# Patient Record
Sex: Male | Born: 1985 | Race: White | Hispanic: No | Marital: Single | Smoking: Current every day smoker
Health system: Southern US, Community
[De-identification: ages and names within clinical notes are randomized; demographics above are authoritative.]

## PROBLEM LIST (undated history)

## (undated) ENCOUNTER — Ambulatory Visit (HOSPITAL_COMMUNITY): Admission: EM | Payer: Self-pay | Source: Home / Self Care

---

## 2018-05-29 ENCOUNTER — Emergency Department: Payer: Self-pay

## 2018-05-29 ENCOUNTER — Emergency Department
Admission: EM | Admit: 2018-05-29 | Discharge: 2018-05-29 | Disposition: A | Discharge status: Home / Self Care | Payer: Self-pay | Attending: Emergency Medicine | Admitting: Emergency Medicine

## 2018-05-29 ENCOUNTER — Other Ambulatory Visit: Payer: Self-pay

## 2018-05-29 DIAGNOSIS — Z79899 Other long term (current) drug therapy: Secondary | ICD-10-CM | POA: Insufficient documentation

## 2018-05-29 DIAGNOSIS — R062 Wheezing: Secondary | ICD-10-CM | POA: Insufficient documentation

## 2018-05-29 DIAGNOSIS — J209 Acute bronchitis, unspecified: Secondary | ICD-10-CM | POA: Insufficient documentation

## 2018-05-29 DIAGNOSIS — F17210 Nicotine dependence, cigarettes, uncomplicated: Secondary | ICD-10-CM | POA: Insufficient documentation

## 2018-05-29 MED ORDER — PREDNISONE 10 MG (21) PO TBPK: ORAL_TABLET | ORAL | 0 refills | Status: AC

## 2018-05-29 MED ORDER — BENZONATATE 200 MG PO CAPS: 200.0000 mg | ORAL_CAPSULE | Freq: Three times a day (TID) | ORAL | 0 refills | Status: AC | PRN

## 2018-05-29 MED ORDER — ALBUTEROL SULFATE (2.5 MG/3ML) 0.083% IN NEBU
2.5000 mg | INHALATION_SOLUTION | Freq: Once | RESPIRATORY_TRACT | Status: AC
  Administered 2018-05-29: 2.5 mg via RESPIRATORY_TRACT
  Filled 2018-05-29: qty 3

## 2018-05-29 MED ORDER — ALBUTEROL SULFATE HFA 108 (90 BASE) MCG/ACT IN AERS: 2.0000 | INHALATION_SPRAY | Freq: Four times a day (QID) | RESPIRATORY_TRACT | 2 refills | Status: AC | PRN

## 2018-05-29 MED ORDER — IPRATROPIUM-ALBUTEROL 0.5-2.5 (3) MG/3ML IN SOLN
3.0000 mL | Freq: Once | RESPIRATORY_TRACT | Status: AC
  Administered 2018-05-29: 3 mL via RESPIRATORY_TRACT
  Filled 2018-05-29: qty 3

## 2018-05-29 MED ORDER — ALBUTEROL SULFATE (2.5 MG/3ML) 0.083% IN NEBU
INHALATION_SOLUTION | RESPIRATORY_TRACT | Status: AC
  Filled 2018-05-29: qty 3

## 2018-05-29 MED ORDER — AZITHROMYCIN 250 MG PO TABS: ORAL_TABLET | ORAL | 0 refills | Status: AC

## 2018-05-29 NOTE — ED Triage Notes (Signed)
Cough cold sx x 1 week.

## 2018-05-29 NOTE — Discharge Instructions (Addendum)
Follow-up with your regular doctor the acute care if not better in 3 to 5 days.  Take the medications as prescribed.  If you are worsening please return the emergency department.

## 2018-05-29 NOTE — ED Triage Notes (Signed)
Pt c/o cough, congestion, URI sx, body aches and fever X 2 weeks. Pt alert and oriented X4, active, cooperative, pt in NAD. RR even and unlabored, color WNL.

## 2018-05-29 NOTE — ED Provider Notes (Signed)
[facility] Emergency Department Provider Note  ____________________________________________   First MD Initiated Contact with Patient 05/29/18 1206     (approximate)  I have reviewed the triage vital signs and the nursing notes.   HISTORY  Chief Complaint Cough; Generalized Body Aches; and Fever    HPI Shawn Roach is a 32 y.o. male presents emergency department complaining of cough and congestion with yellow to green mucus.  States [place] in [place] change in [place] is affected him.  He states he is starting to have some difficulty breathing due to wheezing.  He states he started having fever and body aches about 2 days ago.  Cough symptoms for 14 days.  Denies  chills, chest pain or shortness of breath.   History reviewed. No pertinent past medical history.  There are no active problems to display for this patient.   History reviewed. No pertinent surgical history.  Prior to Admission medications   Medication Sig Start Date End Date Taking? Authorizing Provider  albuterol (PROVENTIL HFA;VENTOLIN HFA) 108 (90 Base) MCG/ACT inhaler Inhale 2 puffs into the lungs every 6 (six) hours as needed for wheezing or shortness of breath. 05/29/18   Sherrie Mustache Roselyn Bering, PA-C  azithromycin (ZITHROMAX Z-PAK) 250 MG tablet 2 pills today then 1 pill a day for 4 days 05/29/18   Sherrie Mustache Roselyn Bering, PA-C  benzonatate (TESSALON) 200 MG capsule Take 1 capsule (200 mg total) by mouth 3 (three) times daily as needed for cough. 05/29/18   Fisher, Roselyn Bering, PA-C  predniSONE (STERAPRED UNI-PAK 21 TAB) 10 MG (21) TBPK tablet Take 6 pills on day one then decrease by 1 pill each day 05/29/18   Faythe Ghee, PA-C    Allergies Patient has no known allergies.  No family history on file.  Social History Social History   Tobacco Use  . Smoking status: Current Every Day Smoker  Substance Use Topics  . Alcohol use: Not Currently    Frequency: Never  . Drug  use: Not on file    Review of Systems  Constitutional: No fever/chills Eyes: No visual changes. ENT: No sore throat. Respiratory: Positive cough and congestion, positive wheezing Genitourinary: Negative for dysuria. Musculoskeletal: Negative for back pain. Skin: Negative for rash.    ____________________________________________   PHYSICAL EXAM:  VITAL SIGNS: ED Triage Vitals [05/29/18 1115]  Enc Vitals Group     BP (!) 148/60     Pulse Rate 86     Resp 18     Temp 98.6 F (37 C)     Temp Source Oral     SpO2 96 %     Weight (!) 340 lb (154.2 kg)     Height 6\' 3"  (1.905 m)     Head Circumference      Peak Flow      Pain Score 6     Pain Loc      Pain Edu?      Excl. in GC?     Constitutional: Alert and oriented. Well appearing and in no acute distress. Eyes: Conjunctivae are normal.  Head: Atraumatic. ENT: TMS clear bilaterally Nose: No congestion/rhinnorhea. Mouth/Throat: Mucous membranes are moist.   NECK: Is supple, no lymphadenopathy is noted  cardiovascular: Normal rate, regular rhythm.  Heart sounds are normal Respiratory: Normal respiratory effort.  No retractions, lungs with diffuse wheezing bilaterally GU: deferred Musculoskeletal: FROM all extremities, warm and well perfused Neurologic:  Normal speech and language.  Skin:  Skin  is warm, dry and intact. No rash noted. Psychiatric: Mood and affect are normal. Speech and behavior are normal.  ____________________________________________   LABS (all labs ordered are listed, but only abnormal results are displayed)  Labs Reviewed - No data to display ____________________________________________   ____________________________________________  RADIOLOGY  Chest x-ray is negative for pneumonia  ____________________________________________   PROCEDURES  Procedure(s) performed: DuoNeb given, albuterol given  Procedures    ____________________________________________   INITIAL  IMPRESSION / ASSESSMENT AND PLAN / ED COURSE  Pertinent labs & imaging results that were available during my care of the patient were reviewed by me and considered in my medical decision making (see chart for details).   Patient is a 32 year old male presents emergency department complaining of URI/bronchitis symptoms.  On physical exam patient has diffuse wheezing in both lung fields.  DuoNeb was given which increased the wheezing but also increased air movement.  Albuterol nebulizer treatment was given.  Chest x-ray is negative for pneumonia.  Explained all the findings to the patient.  He is diagnosed with bronchitis.  He was given a Z-Pak, steroid pack, Tessalon Perles, and albuterol inhaler. He was given discharge instructions.  He states he understands will comply with our treatment plan.  He was discharged in stable condition.     As part of my medical decision making, I reviewed the following data within the electronic MEDICAL RECORD NUMBER Nursing notes reviewed and incorporated, Old chart reviewed, Radiograph reviewed chest x-rays negative for pneumonia, Notes from prior ED visits and [place] Controlled Substance Database  ____________________________________________   FINAL CLINICAL IMPRESSION(S) / ED DIAGNOSES  Final diagnoses:  Acute bronchitis, unspecified organism      NEW MEDICATIONS STARTED DURING THIS VISIT:  Discharge Medication List as of 05/29/2018  1:30 PM    START taking these medications   Details  albuterol (PROVENTIL HFA;VENTOLIN HFA) 108 (90 Base) MCG/ACT inhaler Inhale 2 puffs into the lungs every 6 (six) hours as needed for wheezing or shortness of breath., Starting Mon 05/29/2018, Print    azithromycin (ZITHROMAX Z-PAK) 250 MG tablet 2 pills today then 1 pill a day for 4 days, Print    benzonatate (TESSALON) 200 MG capsule Take 1 capsule (200 mg total) by mouth 3 (three) times daily as needed for cough., Starting Mon 05/29/2018, Print    predniSONE (STERAPRED  UNI-PAK 21 TAB) 10 MG (21) TBPK tablet Take 6 pills on day one then decrease by 1 pill each day, Print         Note:  This document was prepared using Dragon voice recognition software and may include unintentional dictation errors.     Faythe Ghee, PA-C 05/29/18 1512    Sharman Cheek, MD 05/31/18 [phone]

## 2020-06-28 IMAGING — CR DG CHEST 2V
2 series · 2 of 2 positions shown · non-contrast
Comparison: None.

CLINICAL DATA: Cough and chest congestion.  Body aches and fever.

EXAM:
CHEST - 2 VIEW

[chest pa]
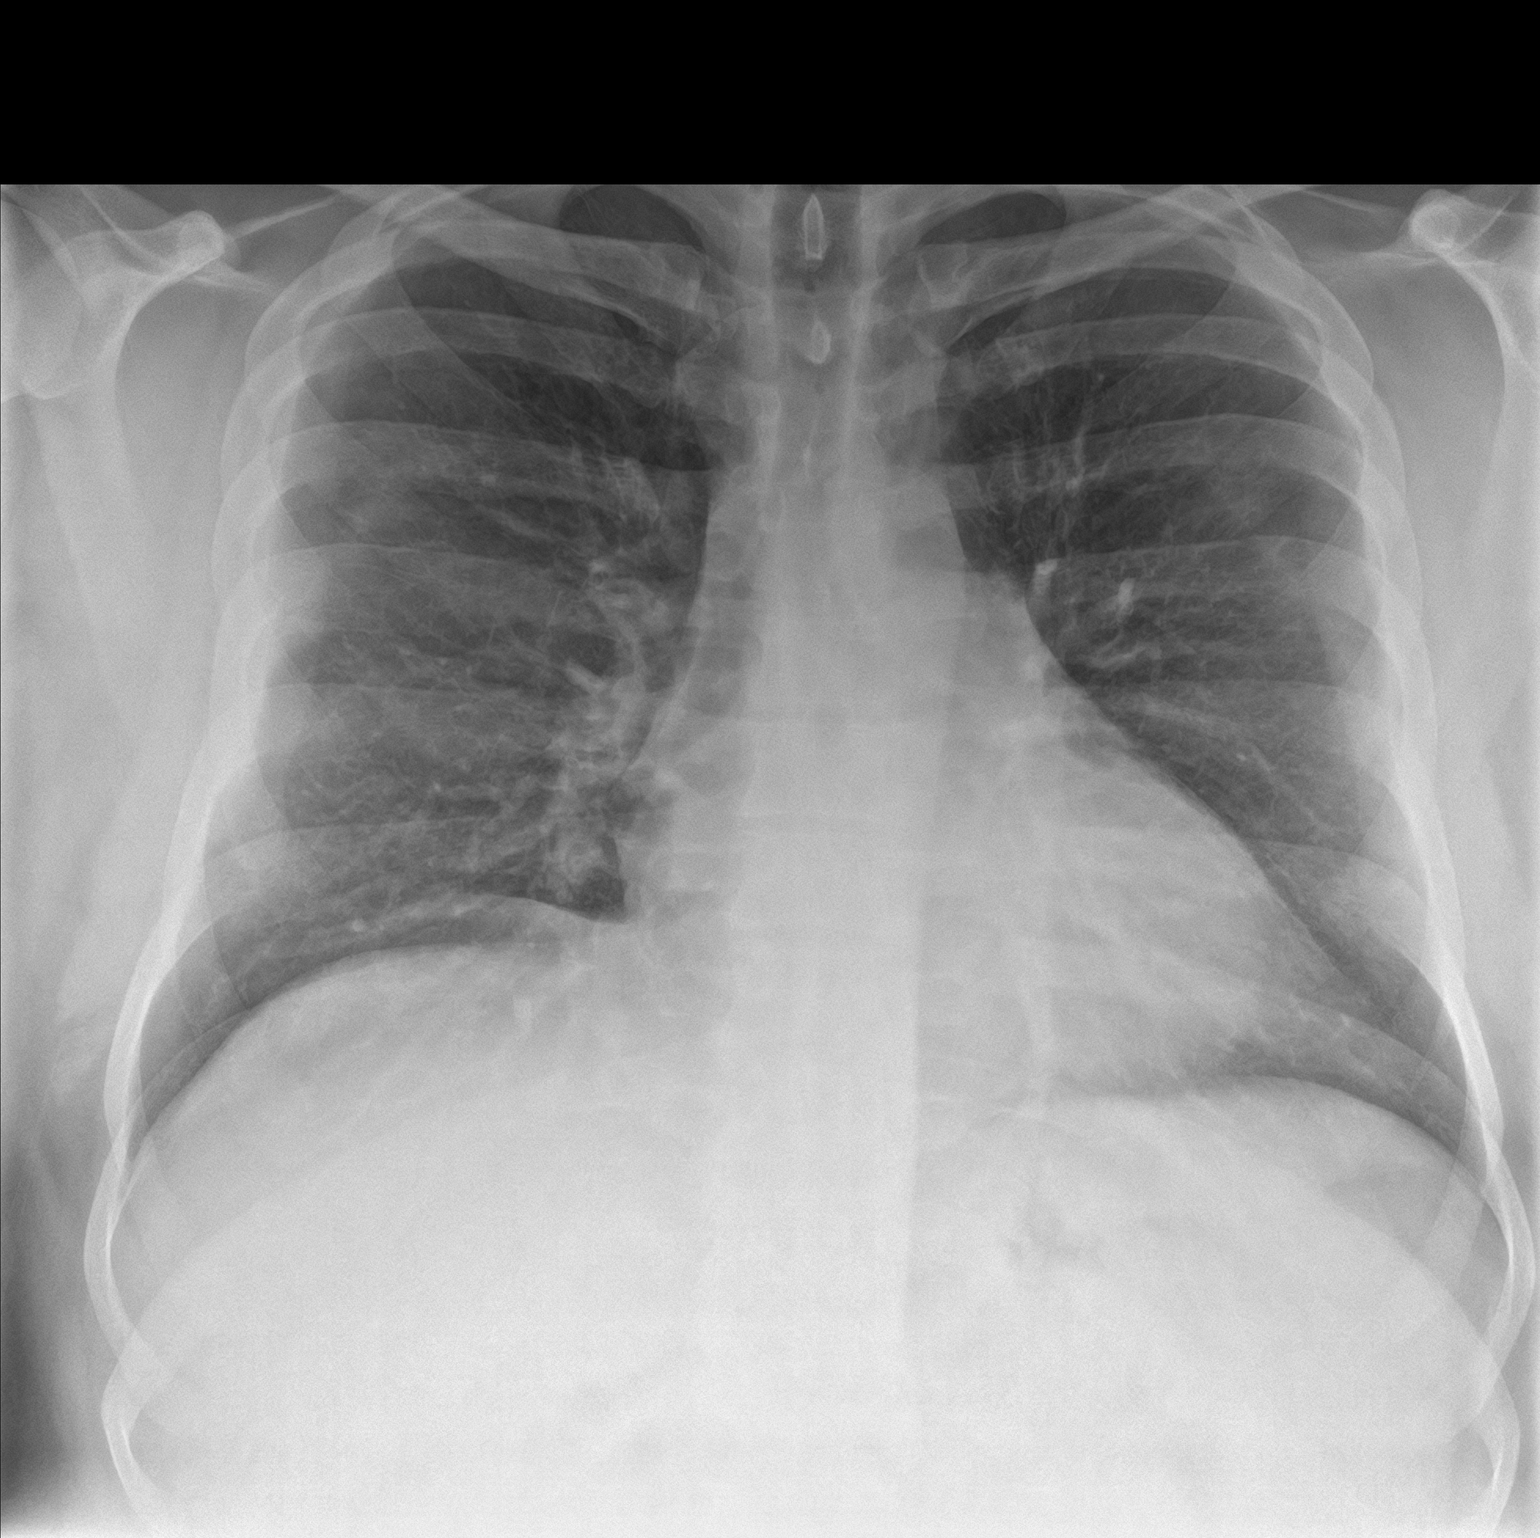

[chest lat]
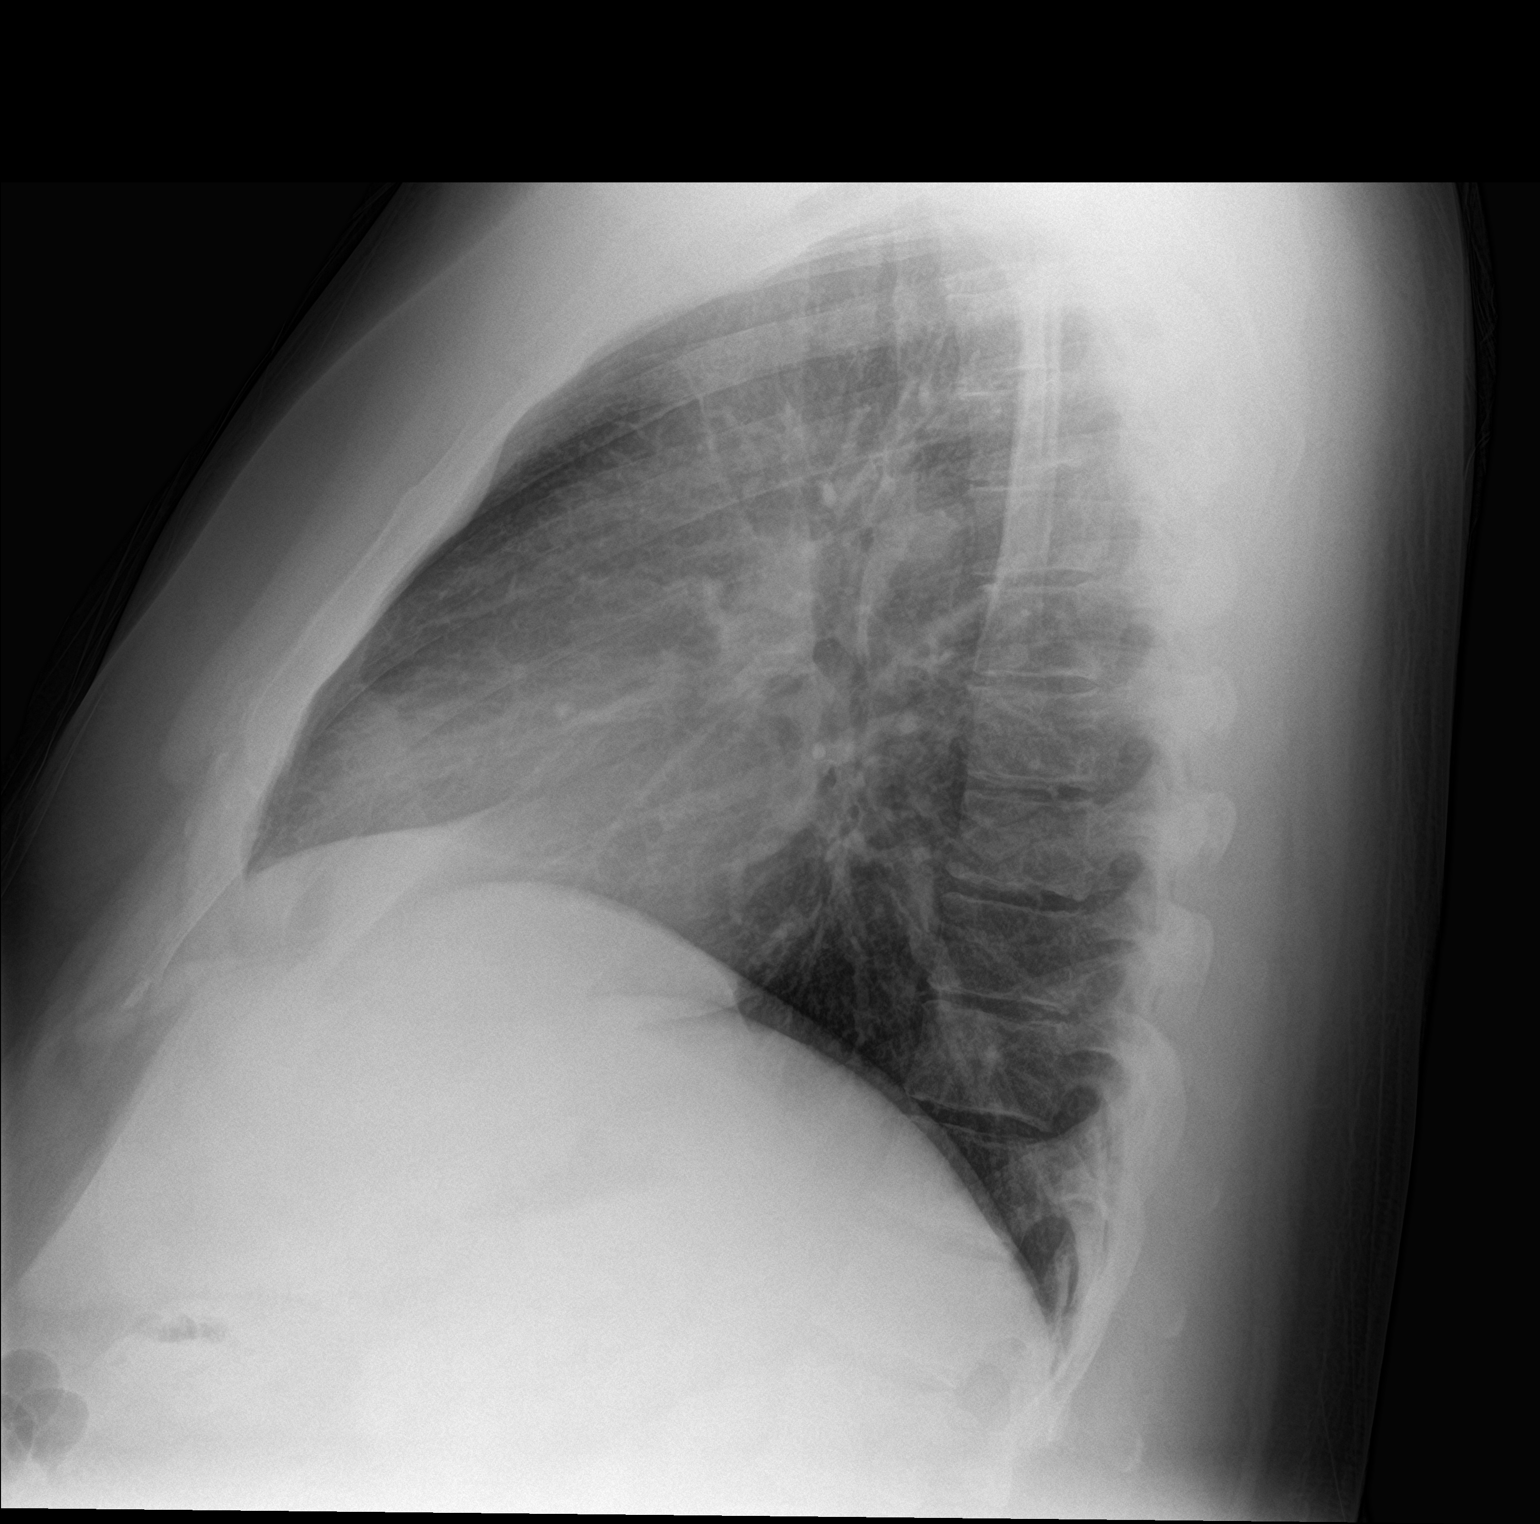

[2 of 2 positions shown; findings below may reference images not displayed]

FINDINGS: Heart size is normal. Mediastinal shadows are normal. There may be
some central bronchial thickening but there is no infiltrate,
collapse or effusion. Bony structures are unremarkable.
IMPRESSION: Possible bronchitis.  No consolidation or collapse.
